# Patient Record
Sex: Male | Born: 1995 | Race: White | Hispanic: No | Marital: Single | Smoking: Former smoker
Health system: Southern US, Community
[De-identification: ages and names within clinical notes are randomized; demographics above are authoritative.]

## PROBLEM LIST (undated history)

## (undated) HISTORY — PX: WISDOM TOOTH EXTRACTION: SHX21

---
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
## 2014-08-05 ENCOUNTER — Ambulatory Visit: Payer: Self-pay | Admitting: Family Medicine

## 2014-08-12 ENCOUNTER — Ambulatory Visit: Payer: Self-pay | Admitting: Family Medicine

## 2016-10-04 ENCOUNTER — Encounter: Payer: Self-pay | Admitting: *Deleted

## 2016-10-04 ENCOUNTER — Ambulatory Visit (INDEPENDENT_AMBULATORY_CARE_PROVIDER_SITE_OTHER): Payer: BLUE CROSS/BLUE SHIELD

## 2016-10-04 ENCOUNTER — Ambulatory Visit
Admission: EM | Admit: 2016-10-04 | Discharge: 2016-10-04 | Disposition: A | Discharge status: Home / Self Care | Payer: BLUE CROSS/BLUE SHIELD | Attending: Family Medicine | Admitting: Family Medicine

## 2016-10-04 DIAGNOSIS — R079 Chest pain, unspecified: Secondary | ICD-10-CM | POA: Diagnosis present

## 2016-10-04 DIAGNOSIS — R0789 Other chest pain: Secondary | ICD-10-CM

## 2016-10-04 DIAGNOSIS — M940 Chondrocostal junction syndrome [Tietze]: Secondary | ICD-10-CM

## 2016-10-04 MED ORDER — TIZANIDINE HCL 4 MG PO CAPS: 4.0000 mg | ORAL_CAPSULE | Freq: Three times a day (TID) | ORAL | 0 refills | Status: DC

## 2016-10-04 MED ORDER — MELOXICAM 15 MG PO TABS: 15.0000 mg | ORAL_TABLET | Freq: Every day | ORAL | 0 refills | Status: DC

## 2016-10-04 NOTE — ED Triage Notes (Signed)
Seen at [facility] ED last Tuesday, pain began as RLQ abd pain. Now c/o left chest pain, pain increases with deep respiration. Point tenderness over rib margin.

## 2016-10-04 NOTE — ED Provider Notes (Signed)
CSN: [id number]     Arrival date & time 10/04/16  [id number] History   First MD Initiated Contact with Patient 10/04/16 1922     Chief Complaint  Patient presents with  . Chest Pain  . Shortness of Breath   (Consider location/radiation/quality/duration/timing/severity/associated sxs/prior Treatment) HPI  This a 20 year old male accompanied by his grandmother stating that he's had chest pain over his left pectoralis region for about one week. Pain vacillates between 4 and an 8 in intensity wax and wane lasting from 1 minute to one hour at a time. He relates that it started in the right lower quadrant. He was seen at [facility] emergency department workup was performed including laboratory work a CAT scan was finally told that he did not have an appendicitis but was most likely from gas. The pain worked his way from the right lower quadrant to the epigastric region and now he states the same pain is in his left anterior chest over the left pectoralis. He does not remember any history of injury. Arm movement does not seem to precipitate the pain. He's had no nausea or vomiting has had no diaphoresis. Does not smoke. He's had no cough. He does notice it more intensely with the deep breathing.      History reviewed. No pertinent past medical history. History reviewed. No pertinent surgical history. History reviewed. No pertinent family history. Social History  Substance Use Topics  . Smoking status: Never Smoker  . Smokeless tobacco: Former NeurosurgeonUser  . Alcohol use Yes    Review of Systems  Constitutional: Negative for activity change, chills, fatigue and fever.  Cardiovascular: Positive for chest pain.  All other systems reviewed and are negative.   Allergies  Review of patient's allergies indicates no known allergies.  Home Medications   Prior to Admission medications   Medication Sig Start Date End Date Taking? Authorizing Provider  meloxicam (MOBIC) 15 MG tablet Take 1 tablet (15 mg total)  by mouth daily. 10/04/16   Lutricia FeilWilliam P Terrel Manalo, PA-C  tiZANidine (ZANAFLEX) 4 MG capsule Take 1 capsule (4 mg total) by mouth 3 (three) times daily. 10/04/16   Lutricia FeilWilliam P Murriel Holwerda, PA-C   Meds Ordered and Administered this Visit  Medications - No data to display  BP 119/68 (BP Location: Left Arm)   Pulse 75   Temp 97.7 F (36.5 C) (Oral)   Resp 16   Ht 6\' 1"  (1.854 m)   Wt 190 lb (86.2 kg)   SpO2 97%   BMI 25.07 kg/m  No data found.   Physical Exam  Constitutional: He is oriented to person, place, and time. He appears well-developed and well-nourished. No distress.  HENT:  Head: Normocephalic and atraumatic.  Eyes: EOM are normal. Pupils are equal, round, and reactive to light. Right eye exhibits no discharge. Left eye exhibits no discharge.  Neck: Normal range of motion. Neck supple.  Cardiovascular: Normal rate, regular rhythm, normal heart sounds and intact distal pulses.  Exam reveals no gallop and no friction rub.   No murmur heard. Pulmonary/Chest: Effort normal and breath sounds normal. No respiratory distress. He has no wheezes. He has no rales.  Musculoskeletal: Normal range of motion.  Neurological: He is alert and oriented to person, place, and time.  Skin: Skin is warm and dry. He is not diaphoretic.  Psychiatric: He has a normal mood and affect. His behavior is normal. Judgment and thought content normal.  Nursing note and vitals reviewed.   Urgent Care Course   Clinical  Course    Procedures (including critical care time)  Labs Review Labs Reviewed - No data to display  Imaging Review Dg Chest 2 View  Result Date: 10/04/2016 CLINICAL DATA:  Left chest pain EXAM: CHEST  2 VIEW COMPARISON:  None. FINDINGS: The heart size and mediastinal contours are within normal limits. Both lungs are clear. The visualized skeletal structures are unremarkable. IMPRESSION: No active cardiopulmonary disease. Electronically Signed   By: Marlan Palau M.D.   On: 10/04/2016 20:23      Visual Acuity Review  Right Eye Distance:   Left Eye Distance:   Bilateral Distance:    Right Eye Near:   Left Eye Near:    Bilateral Near:    Medications - No data to display  ED ECG REPORT   Date: 10/04/2016  EKG Time: 8:42 PM  Rate: 58  Rhythm: sinus bradycardia, sinus arrhythmia  Axis: Normal  Intervals:normal  ST&T Change: No acute changes Narrative Interpretation: Sinus bradycardia with sinus arrhythmia no acute changes seen           MDM   1. Chest wall pain   2. Costochondritis, acute    New Prescriptions   MELOXICAM (MOBIC) 15 MG TABLET    Take 1 tablet (15 mg total) by mouth daily.   TIZANIDINE (ZANAFLEX) 4 MG CAPSULE    Take 1 capsule (4 mg total) by mouth 3 (three) times daily.  Plan: 1. Test/x-ray results and diagnosis reviewed with patient 2. rx as per orders; risks, benefits, potential side effects reviewed with patient 3. Recommend supportive treatment with Rest and symptom avoidance. Heat and ice may be beneficial for pain. I cautioned him regarding the use of muscle relaxants with activities require concentration and judgment. He should not drive while taking these. I recommended that he consider finding a primary care physician that can follow him. Do not have an explanation today why his pain started in the right lower quadrant this worked his way up to his left anterior chest. If he runs a fever or develops a cough or has pain that is constant he should go immediately to the emergency department 4. F/u prn if symptoms worsen or don't improve     Lutricia Feil, PA-C 10/04/16 2044

## 2016-10-06 ENCOUNTER — Telehealth: Payer: Self-pay | Admitting: *Deleted

## 2016-10-06 NOTE — Telephone Encounter (Signed)
Courtesy follow up call, no answer. Left message on answering machine to call back if he has any questions or concerns.

## 2017-08-12 ENCOUNTER — Ambulatory Visit (INDEPENDENT_AMBULATORY_CARE_PROVIDER_SITE_OTHER): Payer: BLUE CROSS/BLUE SHIELD

## 2017-08-12 ENCOUNTER — Ambulatory Visit
Admission: EM | Admit: 2017-08-12 | Discharge: 2017-08-12 | Disposition: A | Discharge status: Home / Self Care | Payer: BLUE CROSS/BLUE SHIELD | Attending: Family Medicine | Admitting: Family Medicine

## 2017-08-12 DIAGNOSIS — R1110 Vomiting, unspecified: Secondary | ICD-10-CM

## 2017-08-12 DIAGNOSIS — J22 Unspecified acute lower respiratory infection: Secondary | ICD-10-CM

## 2017-08-12 DIAGNOSIS — R05 Cough: Secondary | ICD-10-CM

## 2017-08-12 DIAGNOSIS — R059 Cough, unspecified: Secondary | ICD-10-CM

## 2017-08-12 MED ORDER — ONDANSETRON 8 MG PO TBDP: 8.0000 mg | ORAL_TABLET | Freq: Three times a day (TID) | ORAL | 0 refills | Status: DC | PRN

## 2017-08-12 MED ORDER — AZITHROMYCIN 250 MG PO TABS: ORAL_TABLET | ORAL | 0 refills | Status: DC

## 2017-08-12 MED ORDER — HYDROCOD POLST-CPM POLST ER 10-8 MG/5ML PO SUER: 5.0000 mL | Freq: Two times a day (BID) | ORAL | 0 refills | Status: DC | PRN

## 2017-08-12 NOTE — ED Triage Notes (Signed)
Pt with cough and phlegm and dry heaving x past 3 weeks. Denies pain.

## 2017-08-12 NOTE — ED Provider Notes (Signed)
[facility]    CSN: [id number] Arrival date & time: 08/12/17  1535     History   Chief Complaint Chief Complaint  Patient presents with  . Cough    HPI Chad Cruz is a 21 y.o. male.   Patient is a 21 year old white male who comes in today because of coughing. He states that the coughing has caused him to have some chest discomfort when he is on a coughing spell please also coughed repeatedly and he'll cough up thick mucus and subsequently after having coughing spells he'll follow-up as well. He is not the best historian but he is with his grandfather and neither one of them can recall him having a history of pneumonia before. He denies history of asthma denies any wheezing denies any fever. Unable to say was triggered this cough but the cough is stated above was about 3 weeks in the last week is when he's been throwing up. Interestingly denies having nausea when he is not actively coughing and after he's coughs and has his period of coughing he'll have some chest wall tenderness from the coughing and the nausea clears. He denies doing smoke around him he does not smoke only surgeries was in teeth extraction. He denies any chronic or recurrent medical problems and no pertinent family medical history relevant to today's visit.  Should be noted that patient initially told me did not smoke but reviewing his chart history he is a smoker just not smoking now   The history is provided by a relative and the patient. No language interpreter was used.  Cough  Cough characteristics:  Productive Sputum characteristics:  Frothy and green Severity:  Severe Onset quality:  Sudden Duration:  3 weeks Timing:  Intermittent Progression:  Waxing and waning Chronicity:  New Smoker: yes   Context: smoke exposure, upper respiratory infection and with activity   Context: not fumes and not occupational exposure   Relieved by:  Nothing   History reviewed. No pertinent past medical  history.  There are no active problems to display for this patient.   Past Surgical History:  Procedure Laterality Date  . WISDOM TOOTH EXTRACTION         Home Medications    Prior to Admission medications   Medication Sig Start Date End Date Taking? Authorizing Provider  azithromycin (ZITHROMAX Z-PAK) 250 MG tablet Take 2 tablets first day and then 1 po a day for 4 days 08/12/17   Hassan Rowan, MD  chlorpheniramine-HYDROcodone [facility] PENNKINETIC ER) 10-8 MG/5ML SUER Take 5 mLs by mouth every 12 (twelve) hours as needed. 08/12/17   Hassan Rowan, MD  meloxicam (MOBIC) 15 MG tablet Take 1 tablet (15 mg total) by mouth daily. 10/04/16   Lutricia Feil, PA-C  ondansetron (ZOFRAN ODT) 8 MG disintegrating tablet Take 1 tablet (8 mg total) by mouth every 8 (eight) hours as needed for nausea or vomiting. 08/12/17   Hassan Rowan, MD  tiZANidine (ZANAFLEX) 4 MG capsule Take 1 capsule (4 mg total) by mouth 3 (three) times daily. 10/04/16   Lutricia Feil, PA-C    Family History History reviewed. No pertinent family history.  Social History Social History  Substance Use Topics  . Smoking status: Current Some Day Smoker  . Smokeless tobacco: Former Neurosurgeon     Comment: "when I drink"  . Alcohol use Yes     Comment: social     Allergies   Patient has no known allergies.   Review of Systems  Review of Systems  Respiratory: Positive for cough.   Gastrointestinal: Positive for vomiting.  All other systems reviewed and are negative.    Physical Exam Triage Vital Signs ED Triage Vitals  Enc Vitals Group     BP 08/12/17 1551 126/65     Pulse Rate 08/12/17 1551 81     Resp 08/12/17 1551 18     Temp 08/12/17 1551 98.3 F (36.8 C)     Temp Source 08/12/17 1551 Oral     SpO2 08/12/17 1551 99 %     Weight 08/12/17 1550 180 lb (81.6 kg)     Height 08/12/17 1550 6' (1.829 m)     Head Circumference --      Peak Flow --      Pain Score --      Pain Loc --      Pain Edu? --        Excl. in GC? --    No data found.   Updated Vital Signs BP 126/65 (BP Location: Right Arm)   Pulse 81   Temp 98.3 F (36.8 C) (Oral)   Resp 18   Ht 6' (1.829 m)   Wt 180 lb (81.6 kg)   SpO2 99%   BMI 24.41 kg/m   Visual Acuity Right Eye Distance:   Left Eye Distance:   Bilateral Distance:    Right Eye Near:   Left Eye Near:    Bilateral Near:     Physical Exam  Constitutional: He is oriented to person, place, and time. He appears well-developed and well-nourished. No distress.  HENT:  Head: Normocephalic and atraumatic.  Right Ear: External ear normal.  Left Ear: External ear normal.  Eyes: Pupils are equal, round, and reactive to light. EOM are normal.  Neck: Normal range of motion. Neck supple. No tracheal deviation present.  Cardiovascular: Normal rate, regular rhythm and normal heart sounds.   Pulmonary/Chest: Effort normal and breath sounds normal. No respiratory distress. He has no wheezes.  Musculoskeletal: Normal range of motion. He exhibits no edema.  Lymphadenopathy:    He has no cervical adenopathy.  Neurological: He is alert and oriented to person, place, and time.  Skin: Skin is warm and dry. He is not diaphoretic.  Psychiatric: He has a normal mood and affect.  Vitals reviewed.    UC Treatments / Results  Labs (all labs ordered are listed, but only abnormal results are displayed) Labs Reviewed - No data to display  EKG  EKG Interpretation None       Radiology Dg Chest 2 View  Result Date: 08/12/2017 CLINICAL DATA:  Cough EXAM: CHEST  2 VIEW COMPARISON:  10/04/2016 chest radiograph. FINDINGS: Stable cardiomediastinal silhouette with normal heart size. No pneumothorax. No pleural effusion. Lungs appear clear, with no acute consolidative airspace disease and no pulmonary edema. IMPRESSION: No active cardiopulmonary disease. Electronically Signed   By: Delbert Phenix M.D.   On: 08/12/2017 17:54    Procedures Procedures (including critical  care time)  Medications Ordered in UC Medications - No data to display   Initial Impression / Assessment and Plan / UC Course  I have reviewed the triage vital signs and the nursing notes.  Pertinent labs & imaging results that were available during my care of the patient were reviewed by me and considered in my medical decision making (see chart for details).  Clinical Course as of Aug 12 1802  Sat Aug 12, 2017  1715 DG Chest 2 View [EW]  [phone]  DG Chest 2 View [EW]  1716 DG Chest 2 View [EW]    Clinical Course User Index [EW] Hassan Rowan, MD   Chest x-ray was negative will treat with Zithromax Zofran for nausea test next the cough follow-up PCP in 1-2 weeks as needed work note given until Monday  Final Clinical Impressions(s) / UC Diagnoses   Final diagnoses:  Cough  Lower respiratory tract infection    New Prescriptions New Prescriptions   AZITHROMYCIN (ZITHROMAX Z-PAK) 250 MG TABLET    Take 2 tablets first day and then 1 po a day for 4 days   CHLORPHENIRAMINE-HYDROCODONE (TUSSIONEX PENNKINETIC ER) 10-8 MG/5ML SUER    Take 5 mLs by mouth every 12 (twelve) hours as needed.   ONDANSETRON (ZOFRAN ODT) 8 MG DISINTEGRATING TABLET    Take 1 tablet (8 mg total) by mouth every 8 (eight) hours as needed for nausea or vomiting.   Note: This dictation was prepared with Dragon dictation along with smaller phrase technology. Any transcriptional errors that result from this process are unintentional.  Controlled Substance Prescriptions  Controlled Substance Registry consulted? Not Applicable   Hassan Rowan, MD 08/12/17 [id number]

## 2017-10-09 IMAGING — CR DG CHEST 2V
2 series · 2 of 2 positions shown · non-contrast
Comparison: None.

CLINICAL DATA: Left chest pain

EXAM:
CHEST  2 VIEW

[chest pa]
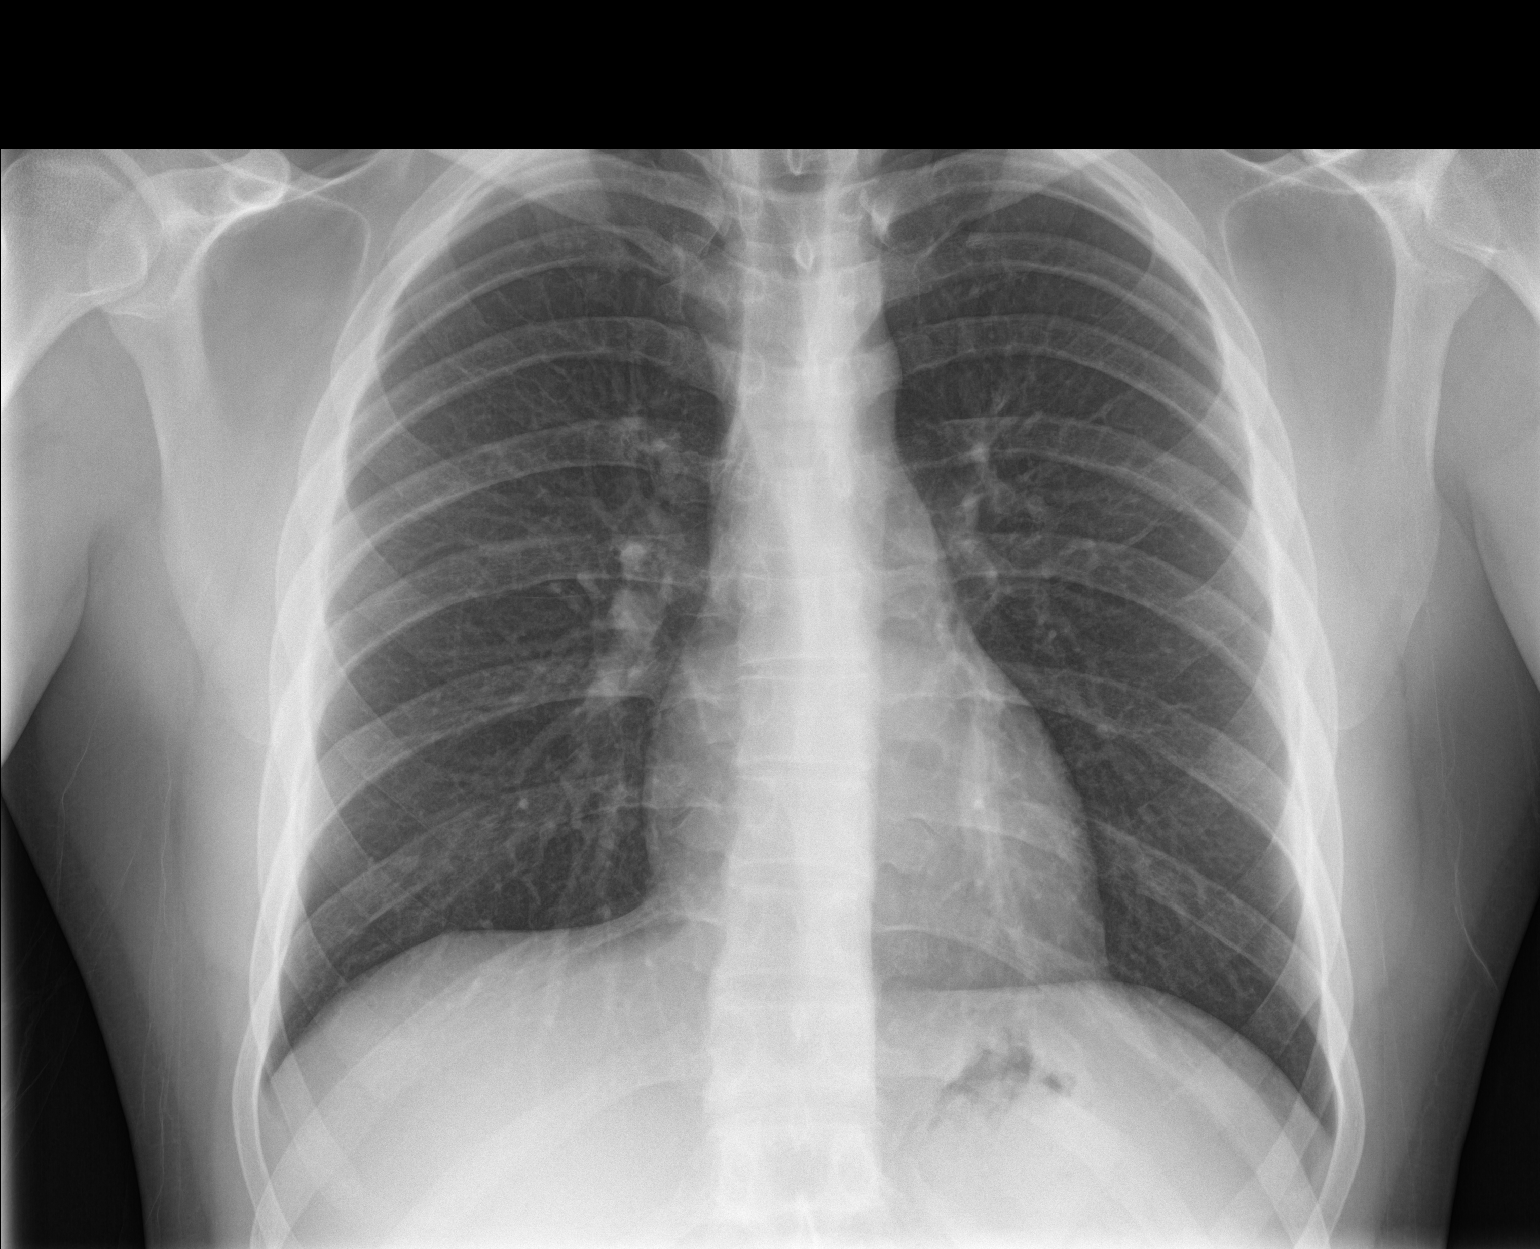

[chest lat]
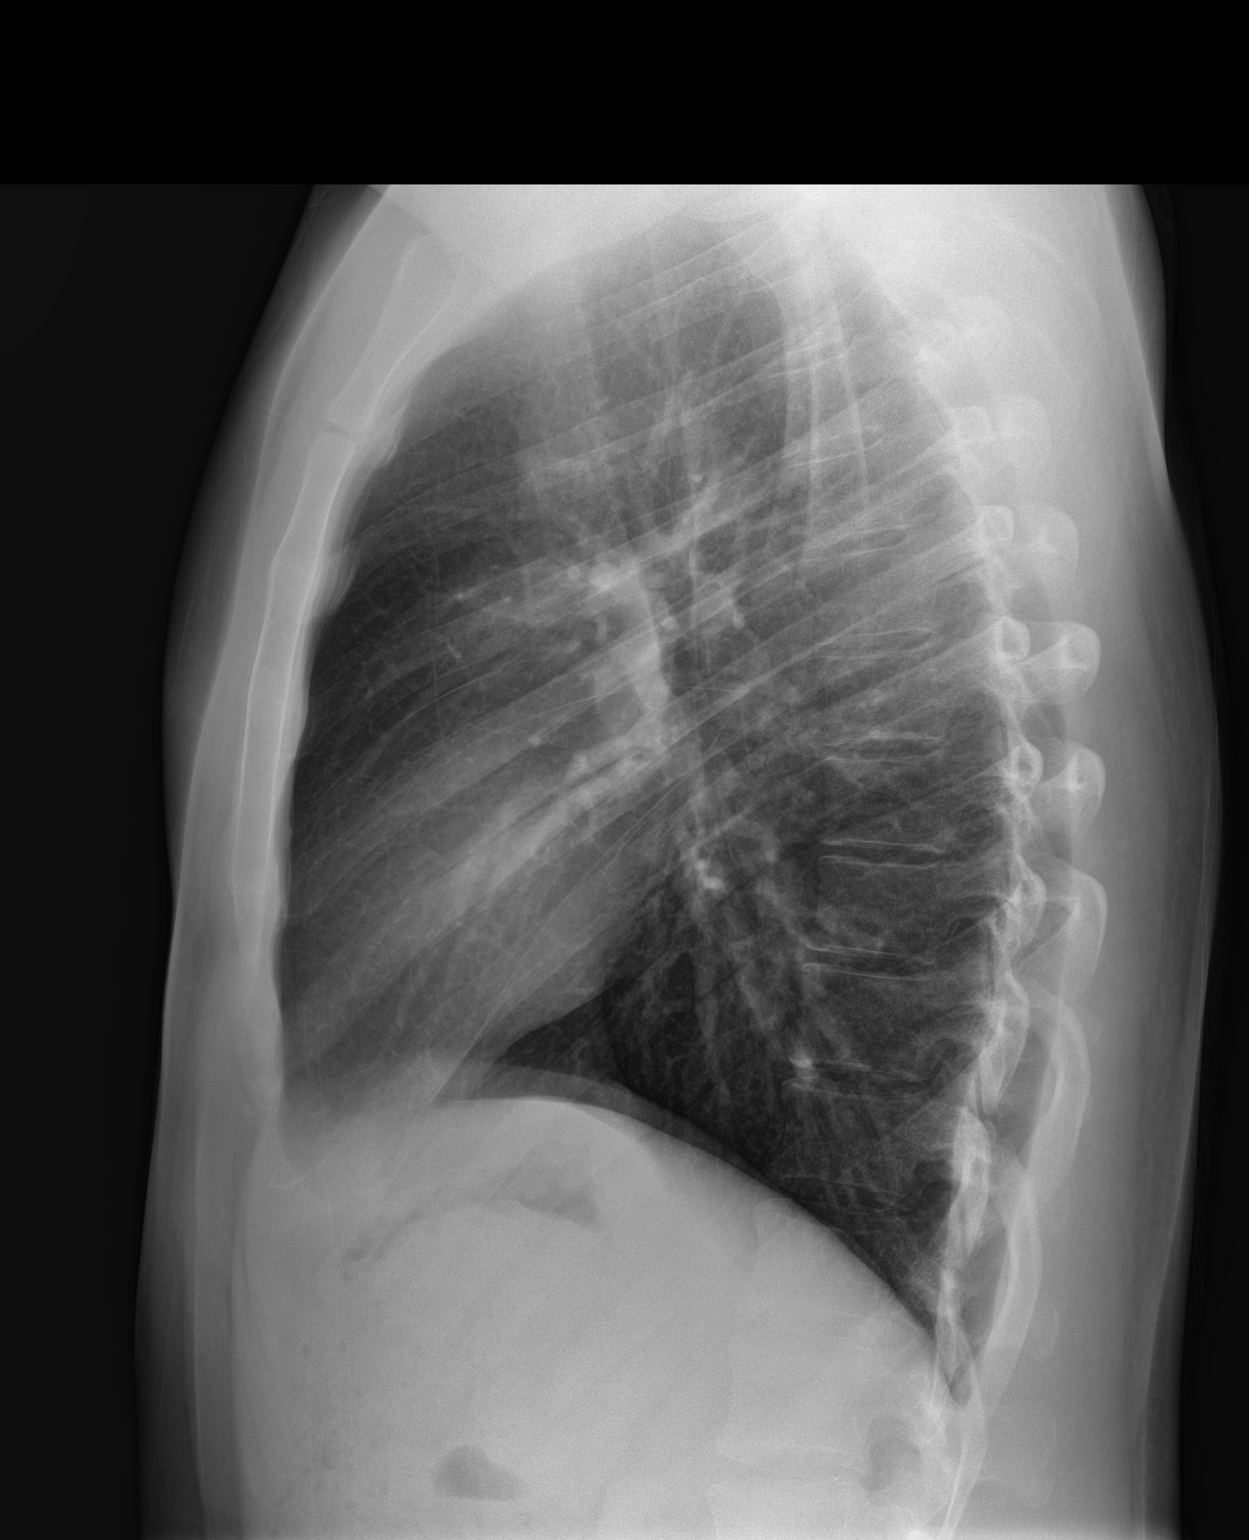

[2 of 2 positions shown; findings below may reference images not displayed]

FINDINGS: The heart size and mediastinal contours are within normal limits.
Both lungs are clear. The visualized skeletal structures are
unremarkable.
IMPRESSION: No active cardiopulmonary disease.

## 2018-06-04 ENCOUNTER — Ambulatory Visit
Admission: EM | Admit: 2018-06-04 | Discharge: 2018-06-04 | Disposition: A | Discharge status: Home / Self Care | Payer: BLUE CROSS/BLUE SHIELD | Attending: Family Medicine | Admitting: Family Medicine

## 2018-06-04 ENCOUNTER — Encounter: Payer: Self-pay | Admitting: Emergency Medicine

## 2018-06-04 ENCOUNTER — Ambulatory Visit (INDEPENDENT_AMBULATORY_CARE_PROVIDER_SITE_OTHER): Payer: BLUE CROSS/BLUE SHIELD

## 2018-06-04 ENCOUNTER — Other Ambulatory Visit: Payer: Self-pay

## 2018-06-04 DIAGNOSIS — S93402A Sprain of unspecified ligament of left ankle, initial encounter: Secondary | ICD-10-CM

## 2018-06-04 NOTE — ED Triage Notes (Signed)
Patient states that he jumped a fence and landed on his left ankle wrong on Saturday. Patient c/o left ankle pain.

## 2018-06-04 NOTE — ED Provider Notes (Signed)
[facility] ____________________________________________  Time seen: Approximately 7:30 PM  I have reviewed the triage vital signs and the nursing notes.   HISTORY  Chief Complaint Ankle Pain (left)   HPI Chad Cruz is a 22 y.o. male presenting for evaluation of left ankle pain post injury that occurred night before last.  Patient states that he jumped over a chain link fence and rolled his left ankle.  Denies any other pain or injuries.  Denies previous issues to his left ankle.  States has been able to still tolerate some weightbearing but with pain.  Reports swelling to ankle.  Did take some over-the-counter ibuprofen at initial time of onset, has not taken any other over-the-counter medications for the same complaints.  Has applied Ace bandage without much change.  Denies other aggravating alleviating factors.  Reports otherwise feels well.  History reviewed. No pertinent past medical history.  There are no active problems to display for this patient.   Past Surgical History:  Procedure Laterality Date  . WISDOM TOOTH EXTRACTION       No current facility-administered medications for this encounter.  No current outpatient medications on file.  Allergies Patient has no known allergies.  History reviewed. No pertinent family history.  Social History Social History   Tobacco Use  . Smoking status: Current Some Day Smoker  . Smokeless tobacco: Former NeurosurgeonUser  . Tobacco comment: "when I drink"  Substance Use Topics  . Alcohol use: Yes    Comment: social  . Drug use: No    Review of Systems Constitutional: No fever/chills Cardiovascular: Denies chest pain. Respiratory: Denies shortness of breath. Gastrointestinal: No abdominal pain.   Musculoskeletal: Negative for back pain. AS above.  Skin: Negative for rash.   ____________________________________________   PHYSICAL EXAM:  VITAL SIGNS: ED Triage Vitals  Enc Vitals Group     BP 06/04/18  1820 (!) 154/83     Pulse Rate 06/04/18 1820 66     Resp 06/04/18 1820 16     Temp 06/04/18 1820 97.6 F (36.4 C)     Temp Source 06/04/18 1820 Oral     SpO2 06/04/18 1820 99 %     Weight 06/04/18 1820 200 lb (90.7 kg)     Height 06/04/18 1820 6\' 1"  (1.854 m)     Head Circumference --      Peak Flow --      Pain Score 06/04/18 1819 5     Pain Loc --      Pain Edu? --      Excl. in GC? --     Constitutional: Alert and oriented. Well appearing and in no acute distress. ENT      Head: Normocephalic and atraumatic. Cardiovascular Good peripheral circulation. Respiratory: Normal respiratory effort without tachypnea nor retractions.  Musculoskeletal:   Bilateral pedal pulses equal and easily palpated.  Ambulatory mild antalgic gait. Except: Left anterior lateral and medial ankle mild tenderness to direct palpation with mild edema, no ecchymosis, skin intact, mild pain with ankle rotation but full range of motion present, no pain with plantarflexion or dorsiflexion, normal distal sensation and capillary refill.  Left lower extremity otherwise nontender. Neurologic:  Normal speech and language. Speech is normal.  Skin:  Skin is warm, dry and intact. No rash noted. Psychiatric: Mood and affect are normal. Speech and behavior are normal. Patient exhibits appropriate insight and judgment   ___________________________________________   LABS (all labs ordered are listed, but only abnormal results are displayed)  Labs  Reviewed - No data to display  RADIOLOGY  Dg Ankle Complete Left  Result Date: 06/04/2018 CLINICAL DATA:  Pain after trauma.  Lateral swelling. EXAM: LEFT ANKLE COMPLETE - 3+ VIEW COMPARISON:  None. FINDINGS: There is no evidence of fracture, dislocation, or joint effusion. There is no evidence of arthropathy or other focal bone abnormality. Soft tissues are unremarkable. IMPRESSION: Negative. Electronically Signed   By: Gerome Sam III M.D   On: 06/04/2018 18:53    ____________________________________________   PROCEDURES Procedures   INITIAL IMPRESSION / ASSESSMENT AND PLAN / ED COURSE  Pertinent labs & imaging results that were available during my care of the patient were reviewed by me and considered in my medical decision making (see chart for details).  Well-appearing patient.  No acute distress.  Left ankle x-ray as above per radiologist and reviewed by myself negative.  Suspect sprain injury.  Supportive ankle splint given in urgent care.  Patient denies need for crutches.  Discussed rest, ice, elevation and over-the-counter ibuprofen.  Discussed gradual increase of activity as tolerated.  Follow-up with orthopedic as needed for continued complaints.  Discussed follow up with Primary care physician this week. Discussed follow up and return parameters including no resolution or any worsening concerns. Patient verbalized understanding and agreed to plan.   ____________________________________________   FINAL CLINICAL IMPRESSION(S) / ED DIAGNOSES  Final diagnoses:  Sprain of left ankle, unspecified ligament, initial encounter     ED Discharge Orders    None       Note: This dictation was prepared with Dragon dictation along with smaller phrase technology. Any transcriptional errors that result from this process are unintentional.         Renford Dills, NP 06/04/18 1958

## 2018-06-04 NOTE — Discharge Instructions (Addendum)
Rest. Ice. Elevate.  Gradual increase activity as tolerated.  Follow-up with orthopedic as discussed for continued pain.  Follow up with your primary care physician this week as needed. Return to Urgent care for new or worsening concerns.

## 2019-06-09 IMAGING — CR DG ANKLE COMPLETE 3+V*L*
3 series · 3 of 3 positions shown · non-contrast
Comparison: None.

CLINICAL DATA: Pain after trauma.  Lateral swelling.

EXAM:
LEFT ANKLE COMPLETE - 3+ VIEW

[ankle ap]
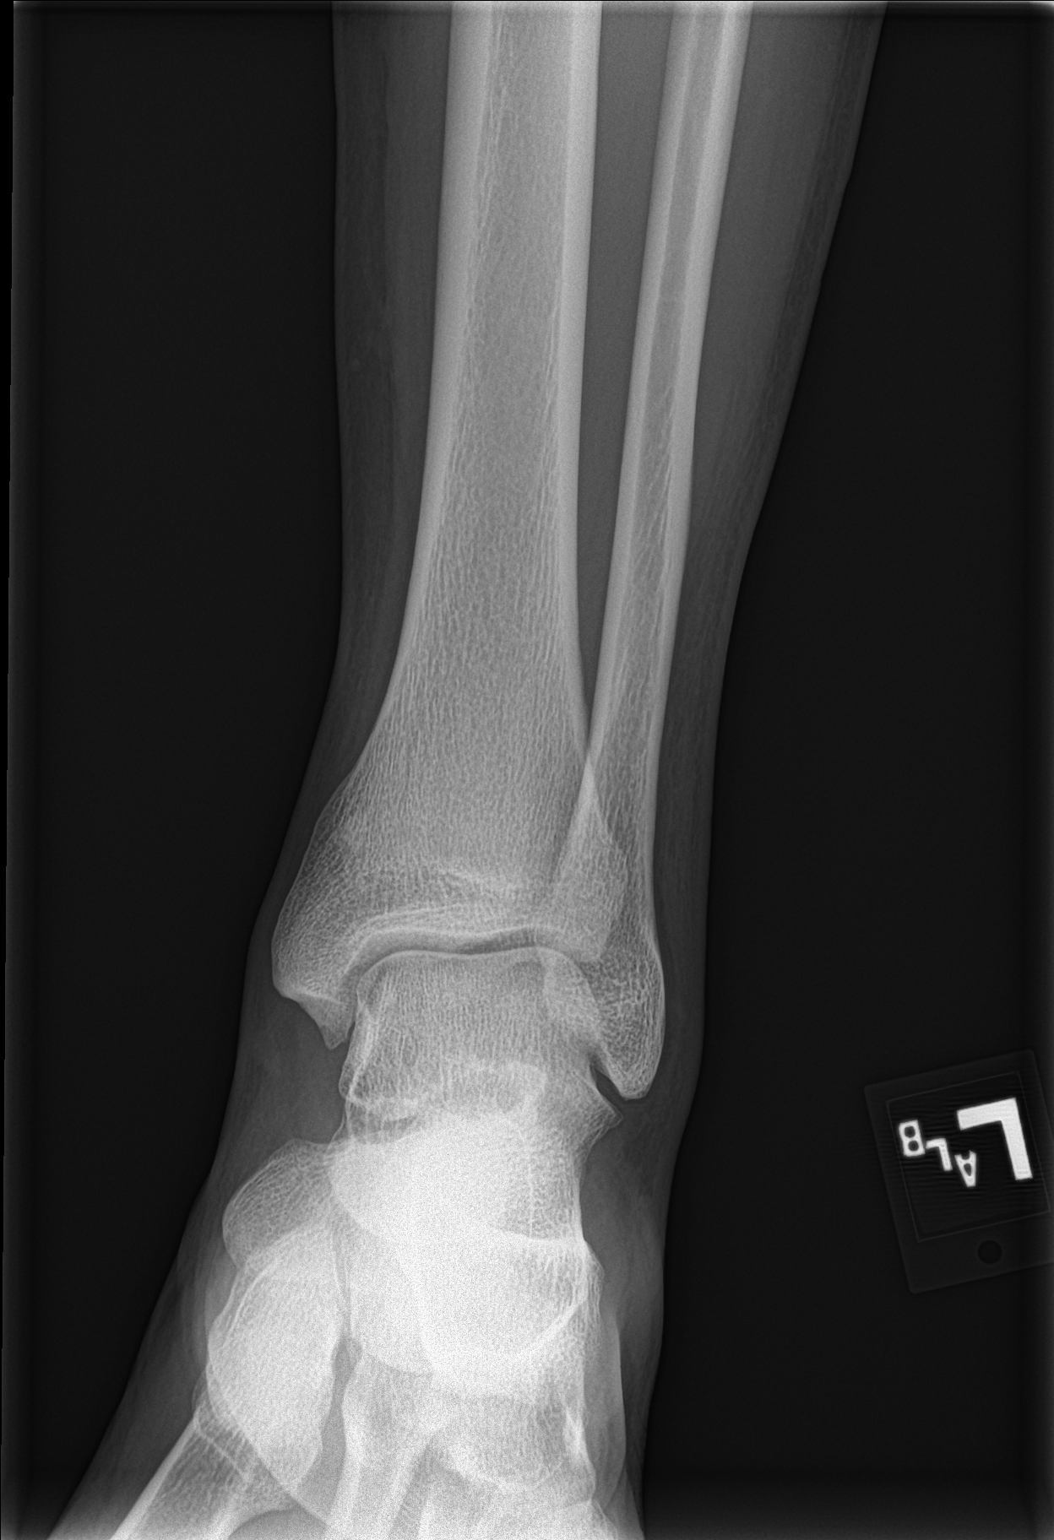

[ankle obl]
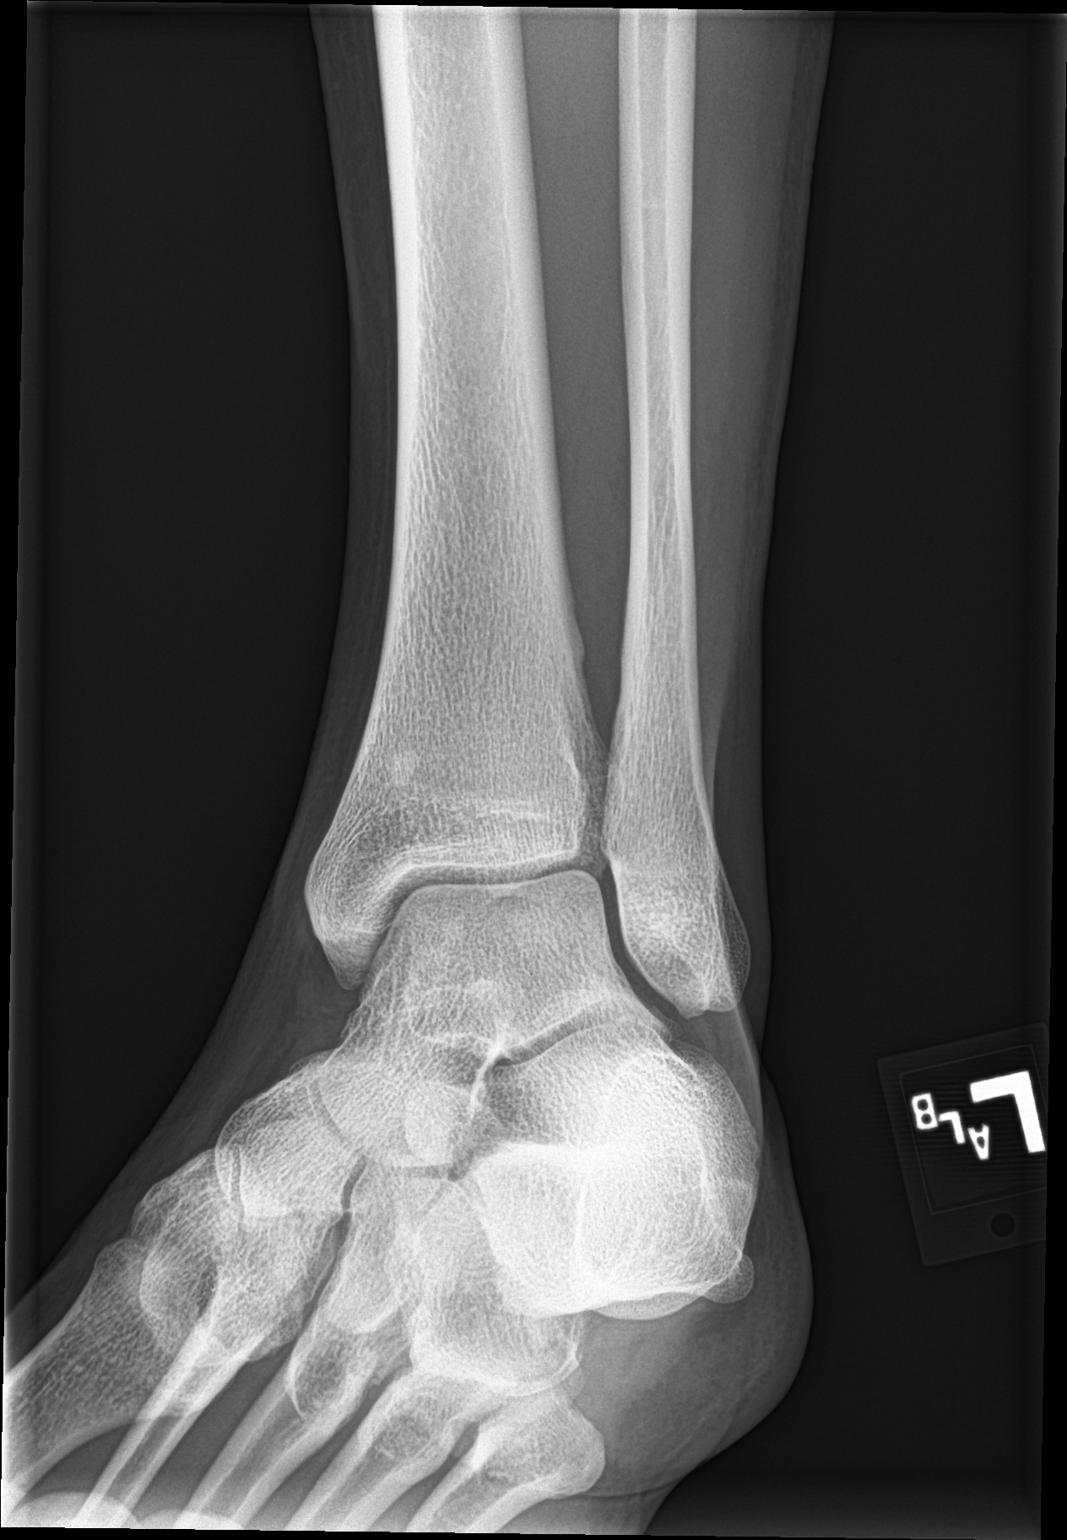

[ankle lat]
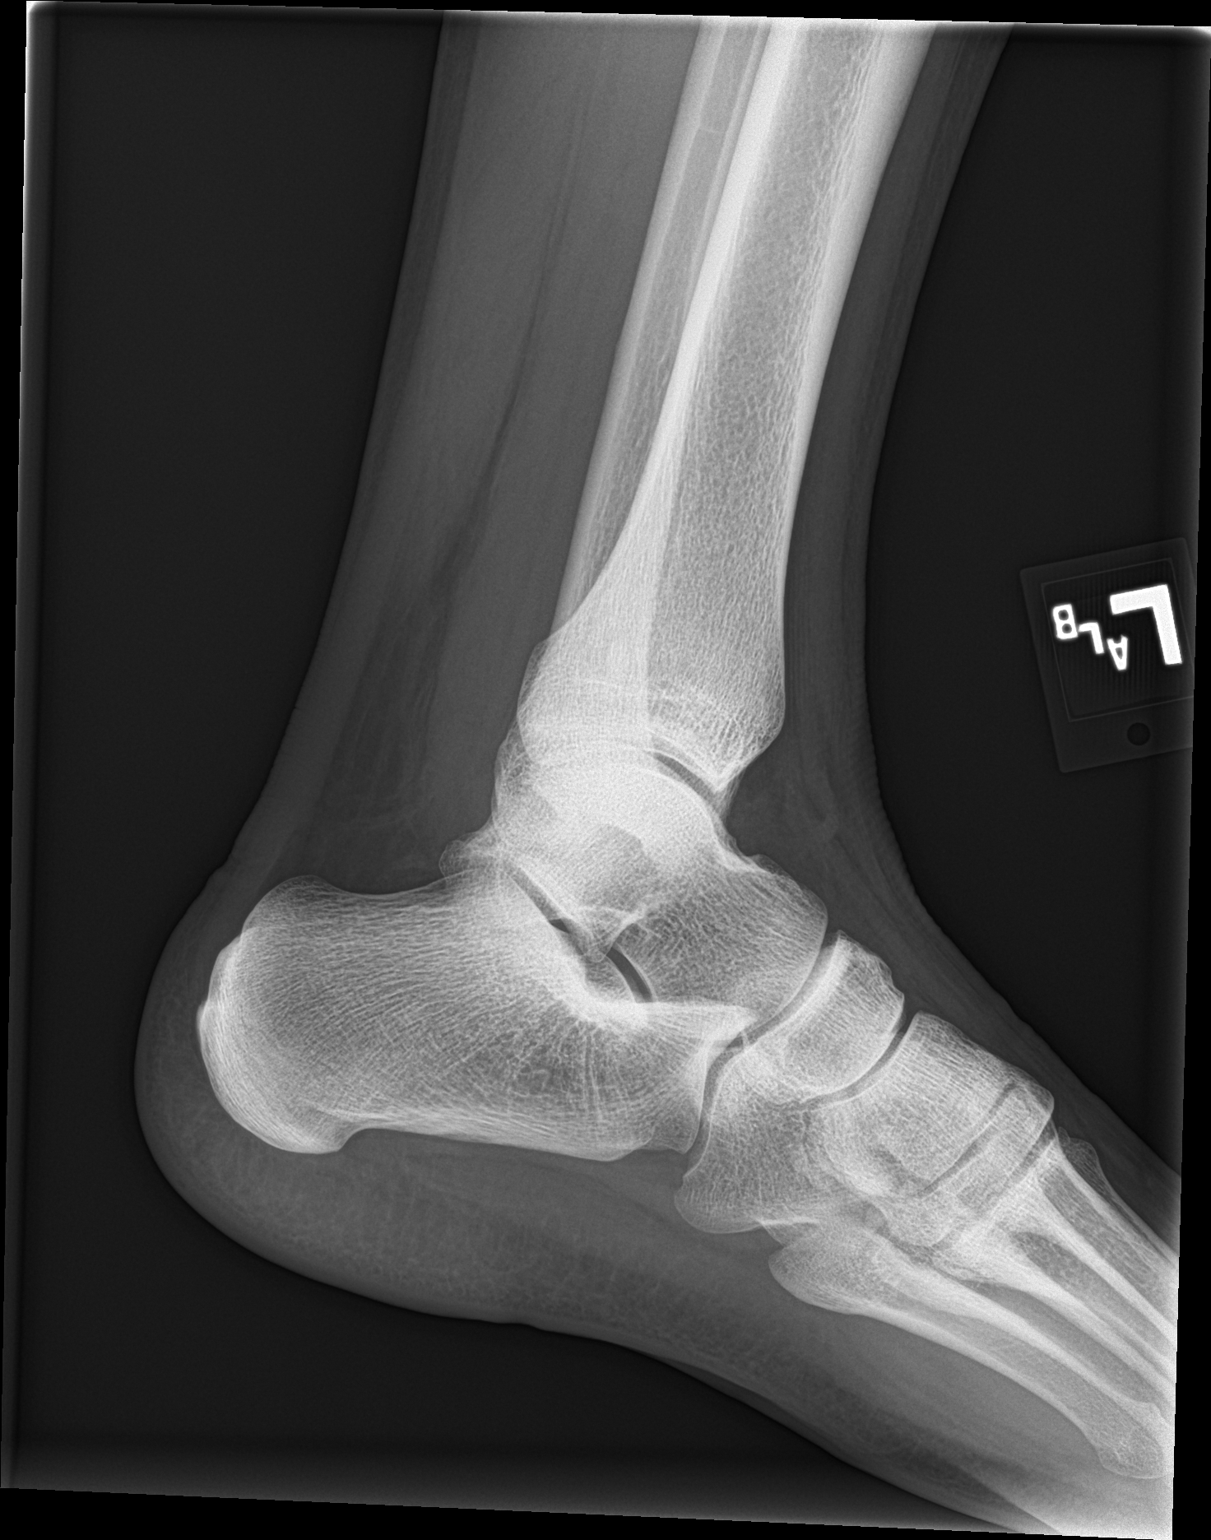

[3 of 3 positions shown; findings below may reference images not displayed]

FINDINGS: There is no evidence of fracture, dislocation, or joint effusion.
There is no evidence of arthropathy or other focal bone abnormality.
Soft tissues are unremarkable.
IMPRESSION: Negative.

## 2020-09-17 ENCOUNTER — Other Ambulatory Visit: Payer: Self-pay

## 2020-09-17 ENCOUNTER — Encounter: Payer: Self-pay | Admitting: Internal Medicine

## 2020-09-17 ENCOUNTER — Ambulatory Visit
Admission: EM | Admit: 2020-09-17 | Discharge: 2020-09-17 | Disposition: A | Discharge status: Home / Self Care | Payer: BC Managed Care – PPO | Attending: Internal Medicine | Admitting: Internal Medicine

## 2020-09-17 DIAGNOSIS — H66002 Acute suppurative otitis media without spontaneous rupture of ear drum, left ear: Secondary | ICD-10-CM | POA: Diagnosis not present

## 2020-09-17 DIAGNOSIS — H60502 Unspecified acute noninfective otitis externa, left ear: Secondary | ICD-10-CM

## 2020-09-17 MED ORDER — AMOXICILLIN 875 MG PO TABS: 875.0000 mg | ORAL_TABLET | Freq: Two times a day (BID) | ORAL | 0 refills | Status: AC

## 2020-09-17 MED ORDER — NEOMYCIN-POLYMYXIN-HC 3.5-10000-1 OT SUSP: 3.0000 [drp] | Freq: Three times a day (TID) | OTIC | 0 refills | Status: AC

## 2020-09-17 NOTE — ED Provider Notes (Signed)
[facility]    CSN: [id number] Arrival date & time: 09/17/20  1015      History   Chief Complaint Chief Complaint  Patient presents with  . Otalgia    left    HPI Chad Cruz is a 24 y.o. male who presents with L ear stuffiness x 2 weeks and has been trying to get is flushed but only gets dark wax, but is having pain now and his canal is getting swollen. Denies URI symptoms or fever. Denies hx of OM as adult  History reviewed. No pertinent past medical history.  There are no problems to display for this patient.   Past Surgical History:  Procedure Laterality Date  . WISDOM TOOTH EXTRACTION       Home Medications    Prior to Admission medications   Not on File    Family History Family History  Problem Relation Age of Onset  . Bipolar disorder Mother   . Diabetes Mother   . Hypertension Father     Social History Social History   Tobacco Use  . Smoking status: Former Smoker    Quit date: 09/17/2018    Years since quitting: 2.0  . Smokeless tobacco: Current User  . Tobacco comment: "when I drink"  Vaping Use  . Vaping Use: Never used  Substance Use Topics  . Alcohol use: Yes    Comment: social  . Drug use: No     Allergies   Patient has no known allergies.   Review of Systems Review of Systems  Constitutional: Negative for fever.  HENT: Positive for ear pain. Negative for congestion, ear discharge, postnasal drip, rhinorrhea and tinnitus.   Respiratory: Negative for cough.   Neurological: Negative for dizziness.     Physical Exam Triage Vital Signs ED Triage Vitals  Enc Vitals Group     BP 09/17/20 1039 117/70     Pulse Rate 09/17/20 1039 68     Resp 09/17/20 1039 18     Temp 09/17/20 1039 98 F (36.7 C)     Temp Source 09/17/20 1039 Oral     SpO2 09/17/20 1039 98 %     Weight 09/17/20 1038 228 lb (103.4 kg)     Height 09/17/20 1038 6\' 1"  (1.854 m)     Head Circumference --      Peak Flow --      Pain Score  09/17/20 1038 6     Pain Loc --      Pain Edu? --      Excl. in GC? --    No data found.  Updated Vital Signs BP 117/70 (BP Location: Left Arm)   Pulse 68   Temp 98 F (36.7 C) (Oral)   Resp 18   Ht 6\' 1"  (1.854 m)   Wt 228 lb (103.4 kg)   SpO2 98%   BMI 30.08 kg/m   Visual Acuity Right Eye Distance:   Left Eye Distance:   Bilateral Distance:    Right Eye Near:   Left Eye Near:    Bilateral Near:     Physical Exam Vitals and nursing note reviewed.  Constitutional:      Appearance: He is obese. He is not toxic-appearing.  HENT:     Head: Atraumatic.     Right Ear: Tympanic membrane, ear canal and external ear normal.     Ears:     Comments: L EAR- has dark wax on the TM, after lavage looks like he has  cloudy matter behind the TM. Ear canal is red, tender and swollen. Has pain with external ear motion.  Eyes:     General: No scleral icterus.    Conjunctiva/sclera: Conjunctivae normal.  Pulmonary:     Effort: Pulmonary effort is normal.  Musculoskeletal:        General: Normal range of motion.     Cervical back: Neck supple.  Skin:    General: Skin is warm and dry.  Neurological:     Mental Status: He is alert and oriented to person, place, and time.     Gait: Gait normal.  Psychiatric:        Mood and Affect: Mood normal.        Behavior: Behavior normal.        Thought Content: Thought content normal.        Judgment: Judgment normal.      UC Treatments / Results  Labs (all labs ordered are listed, but only abnormal results are displayed) Labs Reviewed - No data to display  EKG   Radiology No results found.  Procedures Procedures (including critical care time)  Medications Ordered in UC Medications - No data to display  Initial Impression / Assessment and Plan / UC Course  I have reviewed the triage vital signs and the nursing notes. Has OM and OE. Placed on Amoxicillin and cortisporin otic gtts.  Final Clinical Impressions(s) / UC  Diagnoses   Final diagnoses:  None   Discharge Instructions   None    ED Prescriptions    None     PDMP not reviewed this encounter.   Garey Ham, Cordelia Poche 09/17/20 1112

## 2020-09-17 NOTE — ED Triage Notes (Signed)
Patient in today c/o 2 week history of left ear pain. Patient states the ear is almost swollen shut and he can't sleep. Patient has been using OTC wax softener and ear ache drops.
